# Patient Record
Sex: Female | Born: 1975 | Race: White | Hispanic: No | Marital: Married
Health system: Midwestern US, Academic
[De-identification: ages and names within clinical notes are randomized; demographics above are authoritative.]

---

## 2020-04-10 ENCOUNTER — Encounter: Admit: 2020-04-10 | Discharge: 2020-04-10 | Payer: BC Managed Care – PPO

## 2020-04-10 ENCOUNTER — Ambulatory Visit: Admit: 2020-04-10 | Discharge: 2020-04-10 | Payer: BC Managed Care – PPO

## 2020-04-10 DIAGNOSIS — R011 Cardiac murmur, unspecified: Secondary | ICD-10-CM

## 2020-05-19 ENCOUNTER — Encounter: Admit: 2020-05-19 | Discharge: 2020-05-19 | Payer: BC Managed Care – PPO

## 2020-05-27 ENCOUNTER — Encounter: Admit: 2020-05-27 | Discharge: 2020-05-27 | Payer: BC Managed Care – PPO

## 2020-05-27 DIAGNOSIS — I517 Cardiomegaly: Secondary | ICD-10-CM

## 2020-05-28 ENCOUNTER — Encounter: Admit: 2020-05-28 | Discharge: 2020-05-28 | Payer: BC Managed Care – PPO

## 2020-05-28 DIAGNOSIS — I517 Cardiomegaly: Secondary | ICD-10-CM

## 2020-05-28 DIAGNOSIS — R011 Cardiac murmur, unspecified: Secondary | ICD-10-CM

## 2020-05-28 DIAGNOSIS — R42 Dizziness and giddiness: Secondary | ICD-10-CM

## 2020-05-28 DIAGNOSIS — R931 Abnormal findings on diagnostic imaging of heart and coronary circulation: Secondary | ICD-10-CM

## 2020-05-28 DIAGNOSIS — R0600 Dyspnea, unspecified: Secondary | ICD-10-CM

## 2020-05-28 DIAGNOSIS — Z20822 Encounter for screening laboratory testing for COVID-19 virus in asymptomatic patient: Secondary | ICD-10-CM

## 2020-05-28 DIAGNOSIS — R002 Palpitations: Secondary | ICD-10-CM

## 2020-05-28 NOTE — Patient Instructions
Stress test at [facility]  If they don't call you to schedule.  Scheduling is [phone]    Cardiac MRI order is in you can call Linda to schedule [phone]    [facility] monitor will be mailed out       Follow up as directed.  Call sooner if issues.  Call the [place] nursing line at [phone].  Leave a detailed message for the nurse in [place] with how we can assist you and we will call you back.

## 2020-06-01 ENCOUNTER — Encounter: Admit: 2020-06-01 | Discharge: 2020-06-01 | Payer: BC Managed Care – PPO

## 2020-06-01 NOTE — Progress Notes
Ordering Physician:HANNEN   Type of Monitor: MCOT   Length of Study: 14  UJ:[id number]

## 2020-06-02 ENCOUNTER — Encounter: Admit: 2020-06-02 | Discharge: 2020-06-02 | Payer: BC Managed Care – PPO

## 2020-06-02 ENCOUNTER — Ambulatory Visit: Admit: 2020-06-02 | Discharge: 2020-06-02 | Payer: BC Managed Care – PPO

## 2020-06-02 DIAGNOSIS — R0600 Dyspnea, unspecified: Secondary | ICD-10-CM

## 2020-06-02 DIAGNOSIS — I10 Essential (primary) hypertension: Secondary | ICD-10-CM

## 2020-06-02 DIAGNOSIS — E785 Hyperlipidemia, unspecified: Secondary | ICD-10-CM

## 2020-06-10 ENCOUNTER — Encounter: Admit: 2020-06-10 | Discharge: 2020-06-10 | Payer: BC Managed Care – PPO

## 2020-06-10 NOTE — Telephone Encounter
-----   Message from Dorris Fetch, MD sent at 06/10/2020  8:40 AM CDT -----  This patient has mild abnormalities on the stress test.  I did order on her a cardiac MRI.Please schedule her to come back for a follow-up office visit in [place] but also to start perform, so we discussed the results.Thank you  ----- Message -----  From: Jana Half, MD  Sent: 06/04/2020   8:44 AM CDT  To: Dorris Fetch, MD

## 2020-06-10 NOTE — Telephone Encounter
-----   Message from Dorris Fetch, MD sent at 06/10/2020  8:40 AM CDT -----  This patient has mild abnormalities on the stress test.  I did order on her a cardiac MRI.Please schedule her to come back for a follow-up office visit in [place] but also to start perform, so we discussed the results.Thank you  ----- Message -----  From: Jana Half, MD  Sent: 06/04/2020   8:44 AM CDT  To: Dorris Fetch, MD

## 2020-06-10 NOTE — Telephone Encounter
Called pt with results and recommendations preferred to get all results and discuss with [facility] at an appt her husband was available to come too.

## 2020-06-23 ENCOUNTER — Encounter: Admit: 2020-06-23 | Discharge: 2020-06-23 | Payer: BC Managed Care – PPO

## 2020-06-23 ENCOUNTER — Ambulatory Visit: Admit: 2020-06-23 | Discharge: 2020-06-23 | Payer: BC Managed Care – PPO

## 2020-06-23 DIAGNOSIS — R0600 Dyspnea, unspecified: Secondary | ICD-10-CM

## 2020-06-23 DIAGNOSIS — I517 Cardiomegaly: Secondary | ICD-10-CM

## 2020-06-23 MED ORDER — GADOBENATE DIMEGLUMINE 529 MG/ML (0.1MMOL/0.2ML) IV SOLN
30 mL | Freq: Once | INTRAVENOUS | 0 refills | Status: CP
Start: 2020-06-23 — End: ?
  Administered 2020-06-23: 22:00:00 30 mL via INTRAVENOUS

## 2020-06-23 MED ORDER — SODIUM CHLORIDE 0.9 % IJ SOLN
20 mL | Freq: Once | INTRAVENOUS | 0 refills | Status: CP
Start: 2020-06-23 — End: ?

## 2020-06-25 ENCOUNTER — Encounter: Admit: 2020-06-25 | Discharge: 2020-06-25 | Payer: BC Managed Care – PPO

## 2020-06-25 NOTE — Telephone Encounter
-----   Message from Dorris Fetch, MD sent at 06/24/2020  4:56 PM CDT -----  Please call this patient and let her know that the cardiac MRI demonstrated that she probably does have hypertrophic cardiomyopathy.  I think we do have a hypertrophic cardiomyopathy clinic at [facility] and I seen this patients are seen either by Dr. Paris Lore in this clinic or in the valve clinic.  Please find out and let us further referred the patient.  At this point in time she probably does not need to come back and see me in [place], before being seen in the hypertrophic clinic.Thank you  ----- Message -----  From: Levander Campion Results  Sent: 06/24/2020  10:58 AM CDT  To: Dorris Fetch, MD

## 2020-07-11 ENCOUNTER — Encounter: Admit: 2020-07-11 | Discharge: 2020-07-11 | Payer: BC Managed Care – PPO

## 2020-07-11 NOTE — Progress Notes
Message sent to [employer] regardsing monitor orders.

## 2020-07-24 ENCOUNTER — Encounter: Admit: 2020-07-24 | Discharge: 2020-07-24 | Payer: BC Managed Care – PPO

## 2020-08-31 ENCOUNTER — Ambulatory Visit: Admit: 2020-08-31 | Discharge: 2020-08-31 | Payer: BC Managed Care – PPO

## 2020-08-31 ENCOUNTER — Encounter: Admit: 2020-08-31 | Discharge: 2020-08-31 | Payer: BC Managed Care – PPO

## 2020-08-31 DIAGNOSIS — I421 Obstructive hypertrophic cardiomyopathy: Secondary | ICD-10-CM

## 2020-08-31 DIAGNOSIS — I10 Essential (primary) hypertension: Secondary | ICD-10-CM

## 2020-08-31 DIAGNOSIS — R931 Abnormal findings on diagnostic imaging of heart and coronary circulation: Secondary | ICD-10-CM

## 2020-08-31 DIAGNOSIS — R011 Cardiac murmur, unspecified: Secondary | ICD-10-CM

## 2020-08-31 MED ORDER — METOPROLOL TARTRATE 25 MG PO TAB
25 mg | ORAL_TABLET | Freq: Two times a day (BID) | ORAL | 11 refills | 90.00000 days | Status: AC
Start: 2020-08-31 — End: ?

## 2020-08-31 NOTE — Progress Notes
Date of Service: 08/31/2020    Amanda Franco is a 44 y.o. female.       HPI     Amanda Franco is a 44 year old female who I am being asked to see for evaluation of hypertrophic cardiomyopathy.  She was noticed initially to have a flow murmur by her primary care physician Dr. Leda Min which prompted a referral to our cardiology practice.  Dr. Radford Pax is seen her in has completed her initial evaluation with an echocardiogram and a cardiac MRI.  I did have a chance to personally review her echocardiogram and she has normal left ventricular systolic function with an ejection fraction of 70%.  She did have at least moderate basal septal hypertrophy.  There was a resting outflow tract gradient of 23 mmHg with no Valsalva performed.  She did not have any significant associated mitral or tricuspid insufficiency.  There was systolic anterior motion of the anterior mitral leaflet with partial septal contact which I suspect is what is leading to her outflow tract gradient and murmur.  Additionally, she had a cardiac MRI performed which demonstrated basal anteroseptal hypertrophy of 2 cm with notable moderate systolic anterior motion of the mitral valve and turbulent outflow.  There was a small focus of nonischemic abnormal late gadolinium enhancement however this was much less than 10%.  Looking at her echocardiogram and cardiac MRI, I do believe that both of these are compatible with hypertrophic obstructive cardiomyopathy.  Her resting outflow tract gradient was less than 30 mmHg so I think we can work on optimizing pharmacotherapy.  She does have exertional shortness of breath when she is trying to climb the stairs she also noted some issues with shortness of breath and fluid retention with her last pregnancy 3 years ago.  She denies any family history of hypertrophic cardiomyopathy.  There is no family history of sudden cardiac death or any other high risk features that she possesses that would necessitate the need for an ICD.  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         Vitals:    08/31/20 1105   BP: 130/86   BP Source: Arm, Left Upper   Patient Position: Sitting   Pulse: 78   Weight: 72.8 kg (160 lb 9.6 oz)   Height: 1.6 m (5' 3)   PainSc: Zero     Body mass index is 28.45 kg/m?Marland Kitchen     Past Medical History  Patient Active Problem List    Diagnosis Date Noted   ? HOCM (hypertrophic obstructive cardiomyopathy) (HCC) 08/31/2020   ? Shortness of breath 05/28/2020   ? Lightheadedness 05/28/2020   ? Systolic murmur 05/28/2020   ? Abnormal echocardiogram 05/28/2020   ? Heart palpitations 05/28/2020   ? 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   HENT: Negative.    Eyes: Negative.    Cardiovascular: Positive for dyspnea on exertion.   Respiratory: Negative.    Endocrine: Negative.    Hematologic/Lymphatic: Negative.    Skin: Negative.    Musculoskeletal: Negative.    Gastrointestinal: Negative.    Genitourinary: Negative.    Neurological: Negative.    Psychiatric/Behavioral: Negative.    Allergic/Immunologic: Negative.        Physical Exam  Physical Exam   General Appearance: alert  and oriented, no acute distress  Skin: warm, moist, no ulcers  Head: normocephalic, symmetric  Eyes: EOMI, PERRL, sclera are clear and without icterus  ENT: unremarkable, nares patent  Neck Veins: neck veins are flat, neck veins are not distended  Carotid Arteries: normal carotid upstroke bilaterally, no bruits  Chest Inspection: chest is normal in appearance  Auscultation/Percussion: lungs clear to auscultation, no rales, rhonchi, wheezes or friction rub appreciated  Cardiac Rhythm: regular rhythm and normal rate  Cardiac Auscultation: Normal S1 & S2, no S3 or S4, no rub - normal pmi  Murmurs: 2/6 SEM LUSB  Extremities: no lower extremity edema bilaterally; 2+ symmetric distal pulses  Muskuloskeletal: no obvious deformity  Neurologic Exam: neurological assessment grossly intact  Mood and Affect: Appropriate      Cardiovascular Studies  ECG - NSR, inferior Q    Cardiovascular Health Factors  Vitals BP Readings from Last 3 Encounters:   08/31/20 130/86   05/28/20 120/80   04/10/20 137/78     Wt Readings from Last 3 Encounters:   08/31/20 72.8 kg (160 lb 9.6 oz)   06/23/20 76.7 kg (169 lb)   05/28/20 76.7 kg (169 lb)     BMI Readings from Last 3 Encounters:   08/31/20 28.45 kg/m?   06/23/20 29.94 kg/m?   05/28/20 29.94 kg/m?      Smoking Social History     Tobacco Use   Smoking Status Never Smoker   Smokeless Tobacco Never Used      Lipid Profile No results found for: CHOL  No results found for: HDL  No results found for: LDL  No results found for: TRIG   Blood Sugar No results found for: HGBA1C  No results found for: GLU, GLUF, GLUPOC       Problems Addressed Today  Encounter Diagnoses   Name Primary?   ? Hypertension, unspecified type Yes   ? Abnormal echocardiogram    ? HOCM (hypertrophic obstructive cardiomyopathy) (HCC)    ? Systolic murmur        Assessment and Plan     1. Presumed hypertrophic obstructive cardiomyopathy-I would like to go ahead and initiate her on metoprolol 25 mg twice daily given her resting outflow tract gradient.  On physical exam she did have a systolic ejection murmur which was most prominent the left upper sternal border and slightly augmented with Valsalva.  Our goal is to maintain a resting gradient less than 30 mmHg or a provokable gradient less than 50 mmHg.  If her outflow tract gradients exceed this, we will need to discuss further optimization of her pharmacotherapy or a septal reduction strategy to optimize her hemodynamics.  She is currently completing a Holter monitor and denies any history of palpitations.  She does not possess any high risk features that would necessitate the need for an ICD.  Interestingly, her ECG demonstrates Q waves in the inferior leads and there is no obvious left ventricular hypertrophy.    With regards to the follow-up plan, I would advocate an annual surveillance echocardiogram and 48-hour Holter monitor to ensure that her resting gradient is less than 30 mmHg and provokable gradient is less than 50 mmHg.  We discussed the most common rhythm issue being atrial fibrillation however she does not have any symptoms to suggest this is the case.  If she does develop progressive symptoms a more direct evaluation may be warranted as well.  She is going to be following up with Dr. Radford Pax, her primary cardiologist in January  and I would like to see her back in August with a repeat echocardiogram.  We have given her information on hypertrophic cardiomyopathy and we will plan to follow-up with any further questions.  Thank you for allowing me to participate in her care.         Current Medications (including today's revisions)  ? metoprolol tartrate 25 mg tablet Take one tablet by mouth twice daily.

## 2020-10-05 ENCOUNTER — Encounter: Admit: 2020-10-05 | Discharge: 2020-10-05 | Payer: BC Managed Care – PPO

## 2020-10-05 NOTE — Progress Notes
Records Request-STAT    Medical records request for continuation of care:    Patient has appointment TOMORROW   with  Dr. Avie Arenas .    Please fax records to Cardiovascular Medicine [place] [facility] [phone]    Request records:    Recent Labs        Thank you,      Cardiovascular Medicine  [facility]  [address]  [place], [place] [address]  Phone:  [phone]  Fax:  [phone]

## 2020-10-06 ENCOUNTER — Encounter: Admit: 2020-10-06 | Discharge: 2020-10-06 | Payer: BC Managed Care – PPO

## 2020-10-07 ENCOUNTER — Encounter: Admit: 2020-10-07 | Discharge: 2020-10-07 | Payer: BC Managed Care – PPO

## 2021-02-09 ENCOUNTER — Encounter: Admit: 2021-02-09 | Discharge: 2021-02-09 | Payer: BC Managed Care – PPO

## 2021-09-01 ENCOUNTER — Encounter: Admit: 2021-09-01 | Discharge: 2021-09-01 | Payer: Private Health Insurance - Indemnity

## 2021-09-01 MED ORDER — METOPROLOL TARTRATE 25 MG PO TAB
ORAL_TABLET | Freq: Two times a day (BID) | ORAL | 0 refills | 90.00000 days | Status: AC
Start: 2021-09-01 — End: ?

## 2021-09-09 ENCOUNTER — Encounter: Admit: 2021-09-09 | Discharge: 2021-09-09 | Payer: Private Health Insurance - Indemnity

## 2021-09-09 NOTE — Progress Notes
Pts primary insurance, [employer] does not require PA for echos.    Faxed OV note from Nov 2021 to pts secondary insurance- [facility] [employer]. Fax: [phone] for clinical review for echo approval.    Service reference number: [id number]

## 2021-10-05 ENCOUNTER — Encounter: Admit: 2021-10-05 | Discharge: 2021-10-05 | Payer: Private Health Insurance - Indemnity

## 2021-10-05 MED ORDER — METOPROLOL TARTRATE 25 MG PO TAB
25 mg | ORAL_TABLET | Freq: Two times a day (BID) | ORAL | 0 refills | 90.00000 days | Status: AC
Start: 2021-10-05 — End: ?

## 2021-10-15 ENCOUNTER — Encounter: Admit: 2021-10-15 | Discharge: 2021-10-15 | Payer: Private Health Insurance - Indemnity

## 2021-10-15 NOTE — Progress Notes
Records Request    Medical records request for continuation of care:    Patient has appointment on 10/21/2021   with  Dr. Nickolas Madrid* .    Please fax records to Cardiovascular Medicine [place] [facility] [phone]    Request records: STAT          EKG's  [phone])       Holter Monitor [phone])      Recent Cardiac Testing (2022--2023)    Any cardiac-related records (2022--2023)    Recent Labs (CMP, Lipid Panel)              Thank you,      Cardiovascular Medicine  [facility]  [address]  [place], [place] [address]  Phone:  [phone]  Fax:  [phone]

## 2021-10-20 ENCOUNTER — Encounter: Admit: 2021-10-20 | Discharge: 2021-10-20 | Payer: Private Health Insurance - Indemnity

## 2021-10-20 ENCOUNTER — Ambulatory Visit: Admit: 2021-10-20 | Discharge: 2021-10-20 | Payer: Private Health Insurance - Indemnity

## 2021-10-20 DIAGNOSIS — I10 Essential (primary) hypertension: Secondary | ICD-10-CM

## 2021-10-20 DIAGNOSIS — R931 Abnormal findings on diagnostic imaging of heart and coronary circulation: Secondary | ICD-10-CM

## 2021-10-21 ENCOUNTER — Encounter: Admit: 2021-10-21 | Discharge: 2021-10-21 | Payer: Private Health Insurance - Indemnity

## 2021-10-21 ENCOUNTER — Ambulatory Visit: Admit: 2021-10-21 | Discharge: 2021-10-22 | Payer: Private Health Insurance - Indemnity

## 2021-10-21 DIAGNOSIS — I517 Cardiomegaly: Secondary | ICD-10-CM

## 2021-10-21 DIAGNOSIS — R002 Palpitations: Secondary | ICD-10-CM

## 2021-10-21 DIAGNOSIS — R460 Very low level of personal hygiene: Secondary | ICD-10-CM

## 2021-10-21 DIAGNOSIS — R42 Dizziness and giddiness: Secondary | ICD-10-CM

## 2021-10-21 DIAGNOSIS — R931 Abnormal findings on diagnostic imaging of heart and coronary circulation: Secondary | ICD-10-CM

## 2021-10-21 DIAGNOSIS — I421 Obstructive hypertrophic cardiomyopathy: Secondary | ICD-10-CM

## 2021-10-21 DIAGNOSIS — Z136 Encounter for screening for cardiovascular disorders: Secondary | ICD-10-CM

## 2021-10-21 DIAGNOSIS — R0602 Shortness of breath: Secondary | ICD-10-CM

## 2021-10-21 MED ORDER — METOPROLOL TARTRATE 25 MG PO TAB
ORAL_TABLET | ORAL | 0 refills | 90.00000 days | Status: AC
Start: 2021-10-21 — End: ?

## 2021-10-21 NOTE — Patient Instructions
Thank you for visiting our office today.    We would like to make the following medication adjustments:      Metoprolol 25mg  in morning, 12.5mg  at noon, 25mg  in the evening      Otherwise continue the same medications as you have been doing.          We will be pursuing the following tests after your appointment today:       Orders Placed This Encounter    ECG 12-LEAD    metoprolol tartrate 25 mg tablet         We will plan to see you back in 12 months.  Please call in the meantime with any questions or concerns.        Please allow 5-7 business days for our providers to review your results. All normal results will go to MyChart. If you do not have Mychart, it is strongly recommended to get this so you can easily view all your results. If you do not have mychart, we will attempt to call you once with normal lab and testing results. If we cannot reach you by phone with normal results, we will send you a letter.  If you have not heard the results of your testing after one week please give a call.       Your Cardiovascular Medicine [place] [facility] [place], Gabriel Rung, Brett Canales, and [place])  phone number is [phone].

## 2021-10-28 ENCOUNTER — Encounter: Admit: 2021-10-28 | Discharge: 2021-10-28 | Payer: Private Health Insurance - Indemnity

## 2021-10-28 DIAGNOSIS — I421 Obstructive hypertrophic cardiomyopathy: Secondary | ICD-10-CM

## 2021-10-28 NOTE — Telephone Encounter
Reviewed echo with Dr. Paris Lore. Per Dr. Paris Lore will order TEE for futher eval.    LVM with pt requesting return call.

## 2021-10-29 ENCOUNTER — Encounter: Admit: 2021-10-29 | Discharge: 2021-10-29 | Payer: Private Health Insurance - Indemnity

## 2021-10-29 NOTE — Telephone Encounter
Received VM from pt regarding why the TEE was ordered. Returned call. Spoke to pt. Pt advised that per her last echo report the TEE was ordered to look closer at her aortic valve and rule out subaortic membrane. Pt verbalized understanding.

## 2021-11-03 ENCOUNTER — Encounter: Admit: 2021-11-03 | Discharge: 2021-11-03 | Payer: Private Health Insurance - Indemnity

## 2021-11-03 NOTE — Telephone Encounter
Unable to reach pt at this time. Voice message was left asking pt to call TEE office to receive instructions for TEE procedure on 11/04/21. This RN will attempt to call again.

## 2021-11-03 NOTE — Telephone Encounter
Called pt regarding TEE tomorrow at 1030.  Arrive at 0930.  [facility]. [address].  Need driver which will be husband.  NPO after midnight tonight. Hold vitamins, water pills and diabetes if applicable.  Take heart med with sips of water in am.  Explained TEE procedure. Questions answered.  Reviewed discharge instructions.  Pt had many questions.  Our phone number is [phone].  RK

## 2021-11-04 ENCOUNTER — Ambulatory Visit: Admit: 2021-11-04 | Discharge: 2021-11-04 | Payer: Private Health Insurance - Indemnity

## 2021-11-04 ENCOUNTER — Encounter: Admit: 2021-11-04 | Discharge: 2021-11-04 | Payer: Private Health Insurance - Indemnity

## 2021-11-04 DIAGNOSIS — I421 Obstructive hypertrophic cardiomyopathy: Secondary | ICD-10-CM

## 2021-11-04 MED ORDER — PROPOFOL 10 MG/ML IV EMUL 20 ML (INFUSION)(AM)(OR)
INTRAVENOUS | 0 refills | Status: DC
Start: 2021-11-04 — End: 2021-11-04
  Administered 2021-11-04: 17:00:00 120 ug/kg/min via INTRAVENOUS

## 2021-11-04 MED ORDER — SODIUM CHLORIDE 0.9 % IV SOLP (OR) 500ML
INTRAVENOUS | 0 refills | Status: DC
Start: 2021-11-04 — End: 2021-11-04
  Administered 2021-11-04: 17:00:00 via INTRAVENOUS

## 2021-11-04 NOTE — Anesthesia Post-Procedure Evaluation
Post-Anesthesia Evaluation    Name: Amanda Franco      MRN: [id number]     DOB: 06-Oct-1975     Age: 46 y.o.     Sex: female   __________________________________________________________________________     Procedure Information     Anesthesia Start Date/Time: 11/04/21 1122    Scheduled providers: Vena Austria, MD; Jacquiline Doe, RN    Procedure: TRANSESOPHAGEAL ECHO    Location: Cardiology:[facility]          Post-Anesthesia Vitals  BP: 120/80 (02/02 1220)  Pulse: 71 (02/02 1220)  Respirations: 12 PER MINUTE (02/02 1220)  SpO2: 100 % (02/02 1220)  O2 Device: None (Room air) (02/02 1220) No vitals data found for the desired time range.        Post Anesthesia Evaluation Note    Evaluation location: Pre/Post  Patient participation: recovered; patient participated in evaluation  Level of consciousness: alert  Pain management: adequate    Hydration: normovolemia  Temperature: 36.0C - 38.4C  Airway patency: adequate    Perioperative Events       Post-op nausea and vomiting: no PONV    Postoperative Status  Cardiovascular status: hemodynamically stable  Respiratory status: spontaneous ventilation        Perioperative Events  There were no known notable events for this encounter.

## 2021-11-04 NOTE — Progress Notes
Pre-Operative Assessment for TEE or Cardioversion    Date of Service:  11/04/2021    Amanda Franco is a 46 y.o. y.o. female. With significant HOCM, MR, who is referred for TEE Indication: subaortic membrane evaluation .      she has been compliant with her  none Missed dose: N/Aand ... bleeding. she is Positive for: none and Positive for: none.      GI procedures: none           When: No    Chest pain:  No   SOB: Yes          Medical History:  Medical History:   Diagnosis Date   ? Ventricular hypertrophy 05/27/2020        Surgical History:   No past surgical history on file.    Social History     Social History     Tobacco Use   ? Smoking status: Never   ? Smokeless tobacco: Never   Substance Use Topics   ? Alcohol use: Not Currently   ? Drug use: Never         Allergies                                        No Known Allergies       Current Medications  Current Outpatient Medications on File Prior to Encounter   Medication Sig Dispense Refill   ? metoprolol tartrate 25 mg tablet Take 25mg  by mouth in the morning, take 12.5mg  by mouth at noon, and take 25mg  by mouth in the evening. 180 tablet 0   ? QELBREE 200 mg cp24 Take 3 capsules by mouth daily.       No current facility-administered medications on file prior to encounter.       Vitals  Estimated body mass index is 27.46 kg/m? as calculated from the following:    Height as of this encounter: 160 cm (5' 3).    Weight as of this encounter: 70.3 kg (155 lb).       Patient appears alert and oriented: Yes  NPO: for greater than 8 hours except for clear liquids  Inpatient IV status: 20g R AC     Diagnostic Tests  No results found for: WBC  No results found for: HGB  No results found for: HCT  No results found for: PLTCT  No results found for: NA  No results found for: K  No results found for: MG  No results found for: BUN  No results found for: CR  No results found for: GLU    Last MAC INR Flow Sheet Entry:    Last recorded Lab results:   No results found for: INR  No results found for: PTT        Blood Cultures  Resulted Micro Last 72 Hrs    No results found         Last TEE date: None  Last Cardioversion date: None  Echo procedures within the past 30 days:  No results found.      Device Information on File  No results found for: GENERATOR, EPDEVTYP      Additional Comments:  None    Plan:  Dr. Berdine Addison will plan to proceed with the  TEE.

## 2021-11-04 NOTE — Progress Notes
Care Plan   Care Category & Patient Outcome Goal Met Treatment/  Interventions Plan of the Day RN Name   Cardiovascular  Hemodynamic stability and adequate peripheral perfusion.   Yes   Refer to  patient's chart. Monitor VS per sedation standard. Monitor ECG continuously.  Assess/maintain IV patency.   Kiley Pietig, RN   Respiratory  Patent airway, ease of respiration, and adequate oxygenation.   Yes   Refer to  patient's chart. Maintain open airway. Assess respirations. Monitor O2 saturations. Titrate O2 to keep sat>= 95% or baseline.   Kiley Pietig, RN   Psychological/Emotional/Spiritual  Cope with procedure with support in place.  Spiritual needs are addressed.     Yes   Refer to  patient's chart. Provide adequate and thorough instructions.  Provide a caring and supportive environment.  Communicate patients concerns with other members of the health team.   Kiley Pietig, RN   Pain  Patients pain goal met.   Yes   Refer to  patient's chart. Prepare patient for potentially uncomfortable procedure.  Observe for verbal/nonverbal complaints of pain.  Assess pain.  Provide comfort measures.   Kiley Pietig, RN   Safety/Fall Risk  Free from injury, security maintained.   Yes High Risk    Refer to  patient's chart.   Follow nursing standard of practice for high risks fall patients.   Kiley Pietig, RN   Knowledge Base  Verbalize understanding of procedure/information provided.   Yes   Refer to  patient's chart. Describe the procedure along with what symptoms to expect.  Evaluate patients understanding of procedure.  Encourage patient to ask questions.  Provide additional information as needed.   Kiley Pietig, RN

## 2021-11-04 NOTE — Anesthesia Pre-Procedure Evaluation
Anesthesia Pre-Procedure Evaluation    Name: Amanda Franco      MRN: [id number]     DOB: [birth year]     Age: 46 y.o.     Sex: female   _________________________________________________________________________     Procedure Info:   Procedure Information     Date/Time: 11/04/21 1030    Scheduled providers: Vena Austria, MD; Jacquiline Doe, RN    Procedure: TRANSESOPHAGEAL ECHO    Location: Cardiology:[facility]          Physical Assessment  Vital Signs (last filed in past 24 hours):  BP: 120/74 (02/02 1015)  Pulse: 61 (02/02 1015)  SpO2: 100 % (02/02 1015)  O2 Device: None (Room air) (02/02 1015)  Height: 160 cm (5' 3) (02/02 0956)  Weight: 70.3 kg (155 lb) (02/02 1008)      Patient History   No Known Allergies     Current Medications    Medication Directions   metoprolol tartrate 25 mg tablet Take 25mg  by mouth in the morning, take 12.5mg  by mouth at noon, and take 25mg  by mouth in the evening.   QELBREE 200 mg cp24 Take 3 capsules by mouth daily.         Review of Systems/Medical History        PONV Screening: Non-smoker        Pulmonary          Shortness of breath      Cardiovascular         Valvular problems/murmurs        Orthopnea      Dyspnea on exertion           Physical Exam    Airway Findings      Mallampati: I      TM distance: >3 FB      Neck ROM: full      Mouth opening: good    Dental Findings: Negative      Cardiovascular Findings:         Other findings: murmur    Pulmonary Findings:       Breath sounds clear to auscultation.    Neurological Findings:       Alert and oriented x 3       Diagnostic Tests  Hematology: No results found for: HGB, HCT, PLTCT, WBC, NEUT, ANC, LYMPH, ALC, ABSLYMPHCT, MONA, AMC, EOSA, ABC, BASOPHILS, MCV, MCH, MCHC, MPV, RDW      General Chemistry: No results found for: NA, K, CL, CO2, GAP, BUN, CR, GLU, CA, KETONES, ALBUMIN, LACTIC, OBSCA, MG, TOTBILI, TOTBILCB, PO4   Coagulation: No results found for: PT, PTT, INR      Anesthesia Plan    ASA score: 3 Plan: MAC  Induction method: intravenous      Informed Consent  Anesthetic plan and risks discussed with patient.        Plan discussed with: anesthesiologist and CRNA.

## 2021-11-04 NOTE — Patient Instructions
CARDIOLOGY PROCEDURES           POST SEDATION INSTRUCTIONS      Patient Name: Amanda Franco  MRN#: [id number]  Date: 11/04/2021      Please follow the instructions listed below:    Avoid food and beverages (especially hot liquids) until sensation has returned to your throat.    Resume diet    The following day you may experience a minor sore throat.    Please have someone accompany you, as YOU SHOULD NOT drive or operate machinery for at least 12-24 hours following the procedure.    There may be some residual effects from the sedatives during the procedure.  Do not drive a vehicle for up to 24 hours after receiving sedation.  Do not operate heavy or potentially harmful equipment  Do not make legally binding decisions  Do not drink alcohol for up to 24 hours  Do not communicate through social media for 24 hours.    Other instructions: continue all medications  If you have question or concerns about this procedure, please contact the Cardiology office at [phone], and ask to speak to one of the nurses.      Current Medications List:   metoprolol tartrate 25 mg tablet Take 25mg  by mouth in the morning, take 12.5mg  by mouth at noon, and take 25mg  by mouth in the evening.    QELBREE 200 mg cp24 Take 3 capsules by mouth daily.         Instructions Given To: pt and husband,    Instructions Given By: , RN

## 2022-01-03 ENCOUNTER — Encounter: Admit: 2022-01-03 | Discharge: 2022-01-03 | Payer: Private Health Insurance - Indemnity

## 2022-01-03 ENCOUNTER — Ambulatory Visit: Admit: 2022-01-03 | Discharge: 2022-01-03 | Payer: Private Health Insurance - Indemnity

## 2022-01-03 DIAGNOSIS — I421 Obstructive hypertrophic cardiomyopathy: Secondary | ICD-10-CM

## 2022-01-03 DIAGNOSIS — I10 Essential (primary) hypertension: Secondary | ICD-10-CM

## 2022-01-03 DIAGNOSIS — R0989 Other specified symptoms and signs involving the circulatory and respiratory systems: Secondary | ICD-10-CM

## 2022-01-03 DIAGNOSIS — I517 Cardiomegaly: Secondary | ICD-10-CM

## 2022-01-03 MED ORDER — METOPROLOL SUCCINATE 50 MG PO TB24
50 mg | ORAL_TABLET | Freq: Every day | ORAL | 11 refills | 90.00000 days | Status: AC
Start: 2022-01-03 — End: ?

## 2022-01-03 NOTE — Progress Notes
Date of Service: 01/03/2022    Amanda Franco is a 46 y.o. female.       HPI     Amanda Franco is a 46 year old female with a history of hypertrophic obstructive cardiomyopathy who is currently on medical therapy.  We did have some early concerns about the possibility of a subaortic membrane.  She had a recent transesophageal echocardiogram that did not demonstrate subaortic membrane.  She does have a basal septum which measures around 1.4 to 1.5 cm.  She does have a resting and provokable gradient across her outflow tract.  I had a chance to review her recent transesophageal echocardiogram she does have some systolic anterior motion of the anterior mitral leaflet with concurrent mild mitral insufficiency as well.  From a symptom standpoint, she notes class II-III symptoms and gets fairly short of breath when climbing a flight of stairs.  She states that she will have to wait at the top of the stairs to catch her breath.  About a half a minute.  She does note some changes in her symptoms based on the timing of her metoprolol so we talked about switching her metoprolol over to metoprolol succinate as a once a day long-acting medication which will help improve compliance.  She has not had any significant exertional chest discomfort denies any lightheadedness or dizziness in the last year or 2.  Her primary focus is really optimizing her symptoms in the setting of obstructive cardiomyopathy.         Vitals:    01/03/22 1043   BP: 110/64   BP Source: Arm, Right Upper   Pulse: 67   PainSc: Zero   Weight: 73.5 kg (162 lb)   Height: 160 cm (5' 3)     Body mass index is 28.7 kg/m?Marland Kitchen     Past Medical History  Patient Active Problem List    Diagnosis Date Noted   ? Hypertension 01/03/2022   ? Poor personal hygiene 10/21/2021   ? HOCM (hypertrophic obstructive cardiomyopathy) (HCC) 08/31/2020   ? Shortness of breath 05/28/2020   ? Lightheadedness 05/28/2020   ? Systolic murmur 05/28/2020   ? Abnormal echocardiogram 05/28/2020   ? Heart palpitations 05/28/2020   ? 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   HENT: Negative.    Eyes: Negative.    Cardiovascular: Negative.    Respiratory: Negative.    Endocrine: Negative.    Hematologic/Lymphatic: Negative.    Skin: Negative.    Musculoskeletal: Negative.    Gastrointestinal: Negative.    Genitourinary: Negative.    Neurological: Negative.    Psychiatric/Behavioral: Negative.    All other systems reviewed and are negative.      Physical Exam  Physical Exam   General Appearance: alert and oriented, no acute distress  Skin: warm, moist, no ulcers  Head: normocephalic, symmetric  Eyes: EOMI, PERRL, sclera are clear and without icterus  ENT: unremarkable, nares patent  Neck Veins: neck veins are flat, neck veins are not distended  Carotid Arteries: normal carotid upstroke bilaterally, no bruits  Chest Inspection: chest is normal in appearance  Auscultation/Percussion: lungs clear to auscultation, no rales, rhonchi, wheezes or friction rub appreciated  Cardiac Rhythm: regular rhythm and normal rate  Cardiac Auscultation: Normal S1 & S2, no S3 or S4, no rub - normal pmi  Murmurs: 2/6 SEM  Neurologic Exam: neurological assessment grossly intact  Mood and Affect: Appropriate      Cardiovascular Health  Factors  Vitals BP Readings from Last 3 Encounters:   01/03/22 110/64   11/04/21 120/80   10/21/21 112/76     Wt Readings from Last 3 Encounters:   01/03/22 73.5 kg (162 lb)   11/04/21 70.3 kg (155 lb)   10/21/21 71.1 kg (156 lb 12.8 oz)     BMI Readings from Last 3 Encounters:   01/03/22 28.70 kg/m?   11/04/21 27.46 kg/m?   10/21/21 27.78 kg/m?      Smoking Social History     Tobacco Use   Smoking Status Never   Smokeless Tobacco Never      Lipid Profile No results found for: CHOL  No results found for: HDL  No results found for: LDL  No results found for: TRIG   Blood Sugar No results found for: HGBA1C  No results found for: GLU, GLUF, GLUPOC       Problems Addressed Today  Encounter Diagnoses   Name Primary?   ? Cardiovascular symptoms Yes   ? Hypertension, unspecified type    ? HOCM (hypertrophic obstructive cardiomyopathy) (HCC)        Assessment and Plan     1. Hypertrophic obstructive cardiomyopathy-I would like to discontinue her metoprolol tartrate and start her metoprolol succinate 50 mg daily.  Her goal is to maintain a heart rate of around 60 bpm.  We will plan to repeat her echocardiogram and see her back in 3 months after repeat echocardiogram.  If she continues to have a resting gradient across the outflow tract exceeding 30 mmHg or a provokable gradient exceeding 50 mmHg, we may want to discuss options for septal reduction to help improve her symptoms.    She is continuing to follow with Dr. Radford Pax for cardiology and I will plan to see her back to reevaluate her response to the change in Toprol-XL with regards to her outflow tract obstruction.  Thank you for allowing me to participate in her care.         Current Medications (including today's revisions)  ? FLUoxetine (PROZAC) 20 mg capsule Take one capsule by mouth daily.   ? metoprolol succinate XL (TOPROL XL) 50 mg extended release tablet Take one tablet by mouth daily.

## 2022-01-03 NOTE — Patient Instructions
Change the Toprol 50mg  daily    We will plan to see you back in 3 months with an Echocardiogram

## 2022-03-30 ENCOUNTER — Ambulatory Visit: Admit: 2022-03-30 | Discharge: 2022-03-30 | Payer: Private Health Insurance - Indemnity

## 2022-03-30 ENCOUNTER — Encounter: Admit: 2022-03-30 | Discharge: 2022-03-30 | Payer: Private Health Insurance - Indemnity

## 2022-03-30 DIAGNOSIS — I10 Essential (primary) hypertension: Secondary | ICD-10-CM

## 2022-03-30 DIAGNOSIS — I421 Obstructive hypertrophic cardiomyopathy: Secondary | ICD-10-CM

## 2022-04-07 ENCOUNTER — Ambulatory Visit: Admit: 2022-04-07 | Discharge: 2022-04-07 | Payer: Private Health Insurance - Indemnity

## 2022-04-07 ENCOUNTER — Encounter: Admit: 2022-04-07 | Discharge: 2022-04-07 | Payer: Private Health Insurance - Indemnity

## 2022-04-07 DIAGNOSIS — I421 Obstructive hypertrophic cardiomyopathy: Secondary | ICD-10-CM

## 2022-04-07 DIAGNOSIS — I517 Cardiomegaly: Secondary | ICD-10-CM

## 2022-04-07 DIAGNOSIS — R42 Dizziness and giddiness: Secondary | ICD-10-CM

## 2022-04-07 DIAGNOSIS — I10 Essential (primary) hypertension: Secondary | ICD-10-CM

## 2022-04-07 DIAGNOSIS — R0989 Other specified symptoms and signs involving the circulatory and respiratory systems: Secondary | ICD-10-CM

## 2022-04-07 DIAGNOSIS — R0602 Shortness of breath: Secondary | ICD-10-CM

## 2022-04-07 MED ORDER — METOPROLOL SUCCINATE 100 MG PO TB24
100 mg | ORAL_TABLET | Freq: Every day | ORAL | 3 refills | 90.00000 days | Status: AC
Start: 2022-04-07 — End: ?

## 2022-04-07 NOTE — Progress Notes
Date of Service: 04/07/2022    Amanda Franco is a 46 y.o. female.       HPI     Amanda Franco is a 46 year old female with a diagnosis of hypertrophic obstructive cardiomyopathy.  She has previously undergone a cardiac MRI back in September 2021.  At that time she had a basal anteroseptum that measured 2 cm.  She has had some very minimal late gadolinium enhancement however this was not in excess of 10%.  She has been having difficulties with left ventricular outflow tract obstruction and had a recent echocardiogram in June which demonstrated a provokable gradient of around 60 mmHg at rest.  We had changed her beta-blocker over to Toprol-XL to help improve compliance and tolerability.  She is currently on 50 mg and we will plan to increase her Toprol-XL to 100 mg.  Her ECG in the office today demonstrates a normal sinus rhythm with a rate of 75.  She does not have voltage criteria for left ventricular hypertrophy and does have what appears to be a pseudo infarct pattern involving the inferior wall leads II, III and aVF.  She denies angina but does have exertional dyspnea.  She continues to get short of breath when she exerts herself or climb stairs.  We have not performed coronary angiograms previously.         Vitals:    04/07/22 1245   BP: 110/70   BP Source: Arm, Left Upper   Pulse: 75   PainSc: Zero   Weight: 81.1 kg (178 lb 12.8 oz)   Height: 160 cm (5' 3)     Body mass index is 31.67 kg/m?Marland Kitchen     Past Medical History  Patient Active Problem List    Diagnosis Date Noted   ? Hypertension 01/03/2022   ? Poor personal hygiene 10/21/2021   ? HOCM (hypertrophic obstructive cardiomyopathy) (HCC) 08/31/2020   ? Shortness of breath 05/28/2020   ? Lightheadedness 05/28/2020   ? Systolic murmur 05/28/2020   ? Abnormal echocardiogram 05/28/2020   ? Heart palpitations 05/28/2020   ? 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   HENT: Negative.    Eyes: Negative.    Cardiovascular: Negative. Respiratory: Negative.    Endocrine: Negative.    Hematologic/Lymphatic: Negative.    Skin: Negative.    Musculoskeletal: Negative.    Gastrointestinal: Negative.    Genitourinary: Negative.    Neurological: Negative.    Psychiatric/Behavioral: Negative.    All other systems reviewed and are negative.      Physical Exam  Physical Exam   General Appearance: alert and oriented, no acute distress  Skin: warm, moist, no ulcers  Head: normocephalic, symmetric  Eyes: EOMI, PERRL, sclera are clear and without icterus  ENT: unremarkable, nares patent  Neck Veins: neck veins are flat, neck veins are not distended  Carotid Arteries: normal carotid upstroke bilaterally, no bruits  Chest Inspection: chest is normal in appearance  Auscultation/Percussion: lungs clear to auscultation, no rales, rhonchi, wheezes or friction rub appreciated  Cardiac Rhythm: regular rhythm and normal rate  Cardiac Auscultation: Normal S1 & S2, no S3 or S4, no rub - normal pmi  Murmurs: 2/6 SEM  Extremities: no lower extremity edema bilaterally; 2+ symmetric distal pulses  Muskuloskeletal: no obvious deformity  Neurologic Exam: neurological assessment grossly intact  Mood and Affect: Appropriate      Cardiovascular Studies  ECG - NSR, inf Q    Cardiovascular Health  Factors  Vitals BP Readings from Last 3 Encounters:   04/07/22 110/70   03/30/22 119/68   01/03/22 110/64     Wt Readings from Last 3 Encounters:   04/07/22 81.1 kg (178 lb 12.8 oz)   03/30/22 69.9 kg (154 lb)   01/03/22 73.5 kg (162 lb)     BMI Readings from Last 3 Encounters:   04/07/22 31.67 kg/m?   03/30/22 27.28 kg/m?   01/03/22 28.70 kg/m?      Smoking Social History     Tobacco Use   Smoking Status Never   Smokeless Tobacco Never      Lipid Profile No results found for: CHOL  No results found for: HDL  No results found for: LDL  No results found for: TRIG   Blood Sugar No results found for: HGBA1C  No results found for: GLU, GLUF, GLUPOC       Problems Addressed Today  Encounter Diagnoses   Name Primary?   ? HOCM (hypertrophic obstructive cardiomyopathy) (HCC) Yes   ? Cardiovascular symptoms    ? Ventricular hypertrophy    ? Primary hypertension    ? Lightheadedness    ? Shortness of breath        Assessment and Plan     1. Hypertrophic obstructive cardiomyopathy-we will plan to increase her Toprol-XL to 100 mg daily and reevaluate her symptoms in 2 weeks.  Her cardiac MRI did demonstrate basal septal hypertrophy with a basal septum measuring 2 cm however her ECG has a pseudo infarct pattern.  We have not performed coronary angiograms in the past.  There may be a role for secondary work-up for other causes of left ventricular hypertrophy in light of her ECG in addition to performing coronary angiograms to evaluate her coronary anatomy.  I would like to have her seen by Dr. Sherryll Burger in our heart failure program who also has a specialty in hypertrophic cardiomyopathy and secondary cardiomyopathies.  We will be in contact with her in 2 weeks and we will tentatively plan to follow-up in 3 months.  We did discuss considering initiating Camzyos depending on her response to Toprol-XL.         Current Medications (including today's revisions)  ? FLUoxetine (PROZAC) 20 mg capsule Take one capsule by mouth daily.   ? metoprolol succinate XL (TOPROL XL) 100 mg extended release tablet Take one tablet by mouth daily.

## 2022-04-12 ENCOUNTER — Encounter: Admit: 2022-04-12 | Discharge: 2022-04-12 | Payer: Private Health Insurance - Indemnity

## 2022-04-21 ENCOUNTER — Encounter: Admit: 2022-04-21 | Discharge: 2022-04-21 | Payer: Private Health Insurance - Indemnity

## 2022-04-21 NOTE — Telephone Encounter
I called patient to see how her symptoms were since her OV with [facility] and the increase of Toprol to 100mg  daily.  Per OV:    "Hypertrophic obstructive cardiomyopathy-we will plan to increase her Toprol-XL to 100 mg daily and reevaluate her symptoms in 2 weeks. Marland KitchenWe did discuss considering initiating Camzyos depending on her response to Toprol-XL."    I had to LVM for call back.  Patient called back and LVM reporting that initially her symptoms improved, but after a few days she could not tell any difference.  Patient reports she now feels about the same as she had previously.  Will route to provider for review.

## 2022-04-22 ENCOUNTER — Encounter: Admit: 2022-04-22 | Discharge: 2022-04-22 | Payer: Private Health Insurance - Indemnity

## 2022-05-09 ENCOUNTER — Encounter: Admit: 2022-05-09 | Discharge: 2022-05-09 | Payer: Private Health Insurance - Indemnity

## 2022-06-01 ENCOUNTER — Encounter: Admit: 2022-06-01 | Discharge: 2022-06-01 | Payer: Private Health Insurance - Indemnity

## 2022-06-22 ENCOUNTER — Encounter: Admit: 2022-06-22 | Discharge: 2022-06-22 | Payer: Private Health Insurance - Indemnity

## 2022-07-05 ENCOUNTER — Encounter: Admit: 2022-07-05 | Discharge: 2022-07-05 | Payer: Private Health Insurance - Indemnity

## 2022-07-14 ENCOUNTER — Encounter: Admit: 2022-07-14 | Discharge: 2022-07-14 | Payer: Private Health Insurance - Indemnity

## 2022-07-14 DIAGNOSIS — I421 Obstructive hypertrophic cardiomyopathy: Secondary | ICD-10-CM

## 2022-07-14 DIAGNOSIS — R0602 Shortness of breath: Secondary | ICD-10-CM

## 2022-07-14 DIAGNOSIS — R931 Abnormal findings on diagnostic imaging of heart and coronary circulation: Secondary | ICD-10-CM

## 2022-07-14 DIAGNOSIS — I517 Cardiomegaly: Secondary | ICD-10-CM

## 2022-07-14 NOTE — Progress Notes
Received call back to patient. She is agreeable to genetic testing, TC-PYP, and lab testing prior to [place]. She would like to reschedule OV with [facility] to week of 12/11.    Home address confirmed. Genetic testing kit ordered through [facility]. Requested kit be mailed to patient's home address on file.    Patient will complete blood tests at [facility] the week of 12/4. Lab orders mailed to patient's home along with appointment information.    Future Appointments   Date Time Provider [place]   09/12/2022 11:00 AM [facility] CVM Procedur   09/14/2022 10:00 AM Leane Platt, MD CVMSNCL CVM Exam

## 2022-07-19 ENCOUNTER — Encounter: Admit: 2022-07-19 | Discharge: 2022-07-19 | Payer: Private Health Insurance - Indemnity

## 2022-07-19 NOTE — Progress Notes
Difficulties getting a hold of patient;  She does not use email. She cancelled her October cardiology Heart fx appt.  She left a message that I could mail her camzyos info and that she was out of her other "cardiac med"  I assume metoprolol.  She also mentioned she was moving. I left another VM this morning to get new address to mail camzyos forms and information

## 2022-08-29 IMAGING — US ECHOCOMPL
1 series · 12 of 24 positions shown · non-contrast
Comparison: none

[Series 1: us echo 2d, complete · 97 acquisitions, 12 frames shown]
[im 5/97]
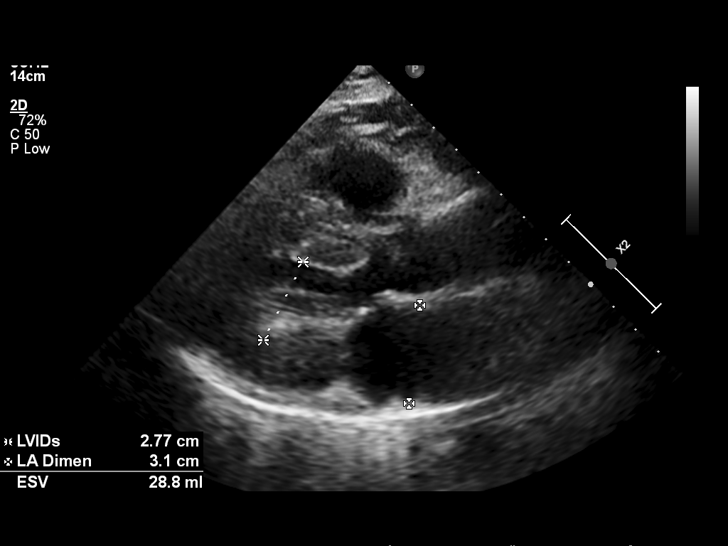
[im 13/97]
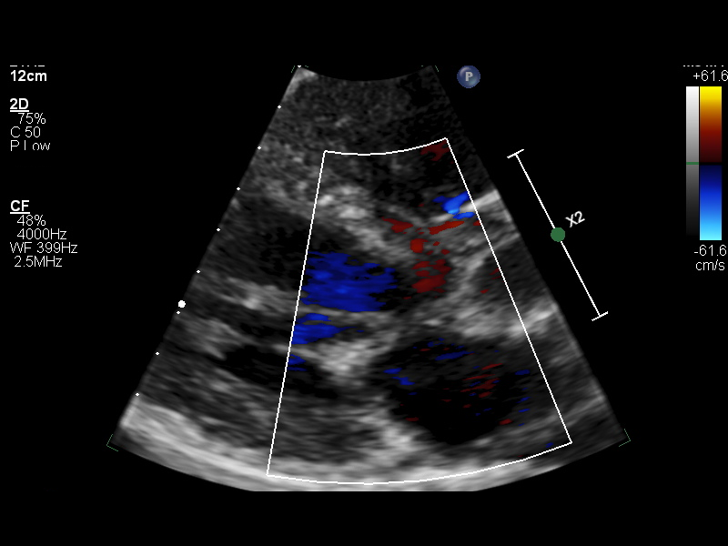
[im 17/97]
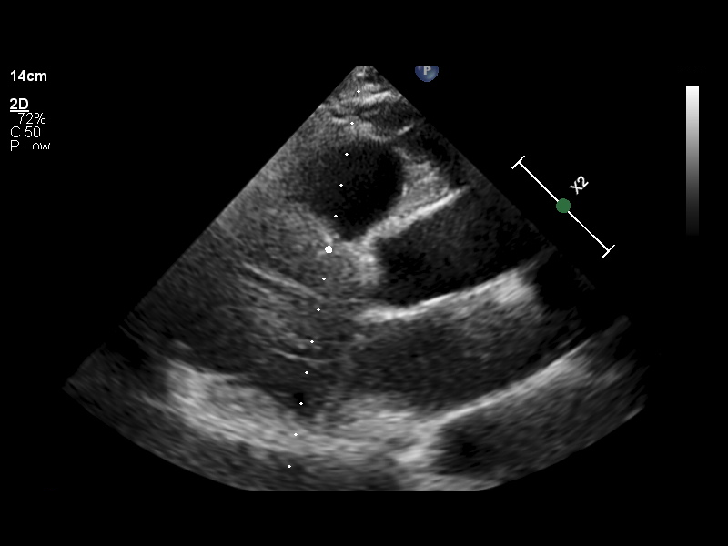
[im 26/97]
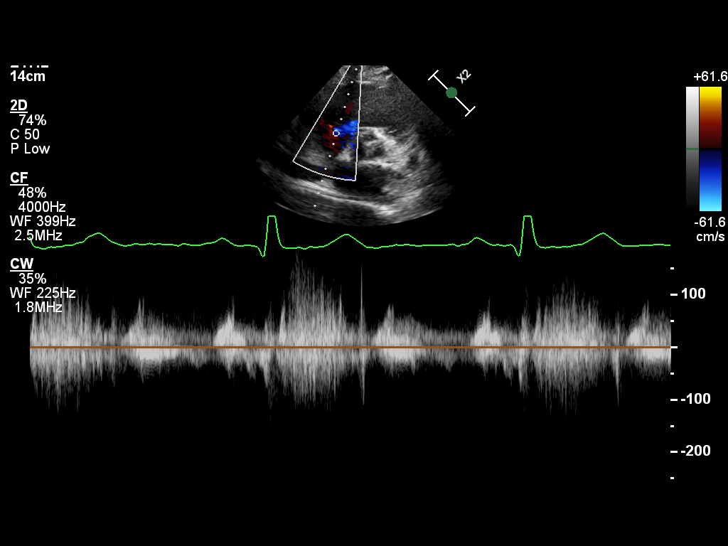
[im 34/97]
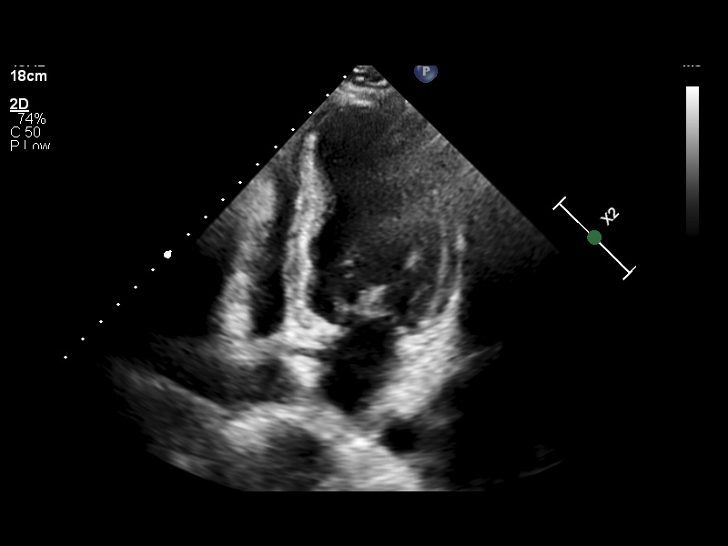
[im 38/97]
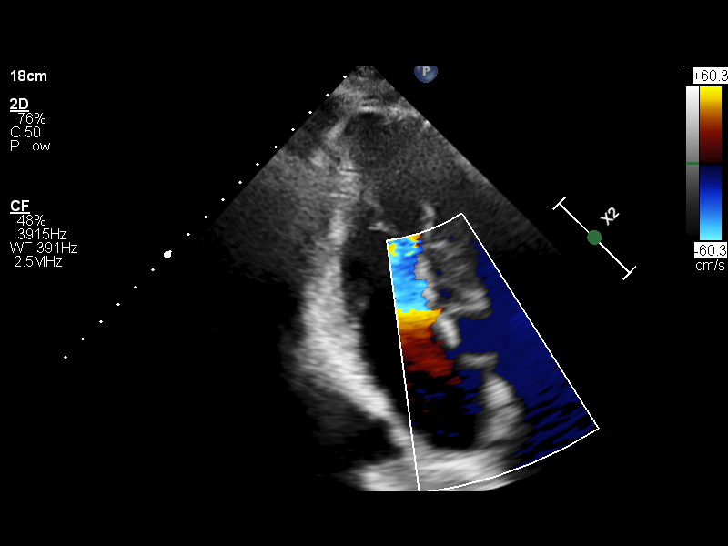
[im 46/97]
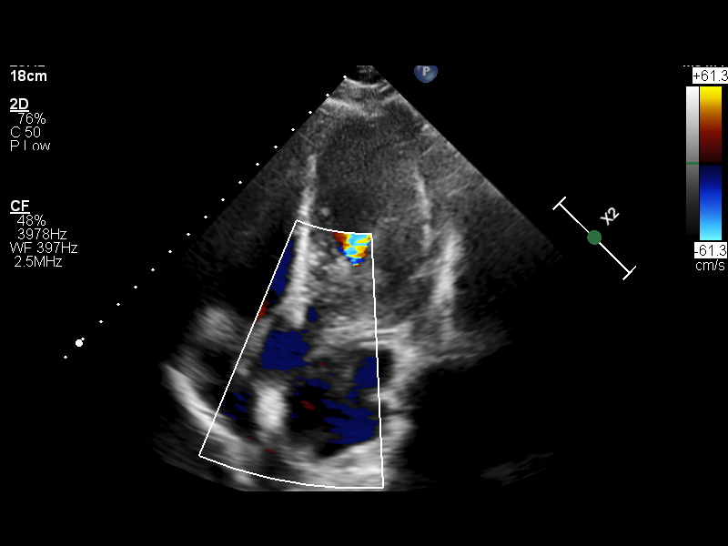
[im 59/97]
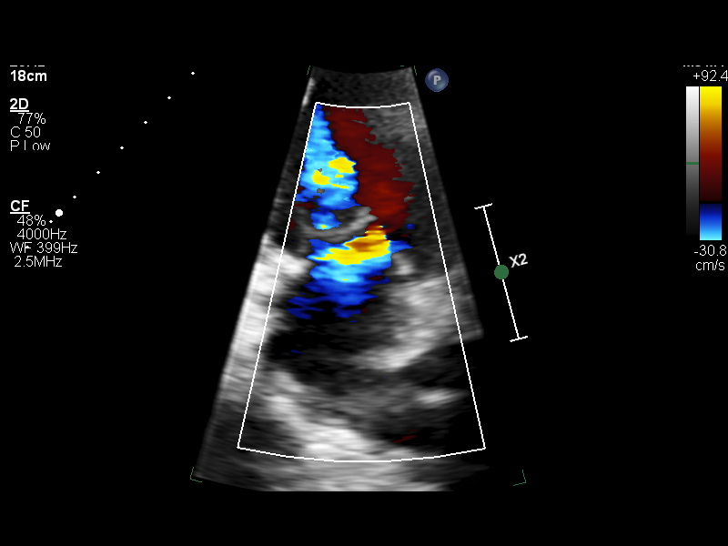
[im 67/97]
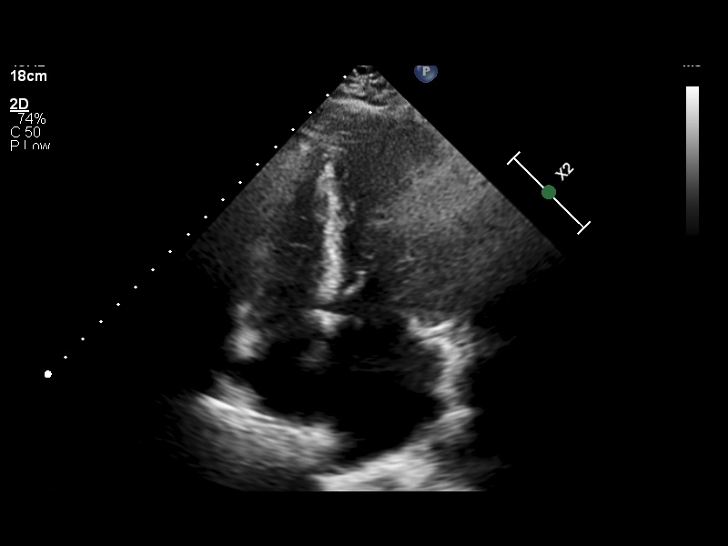
[im 80/97]
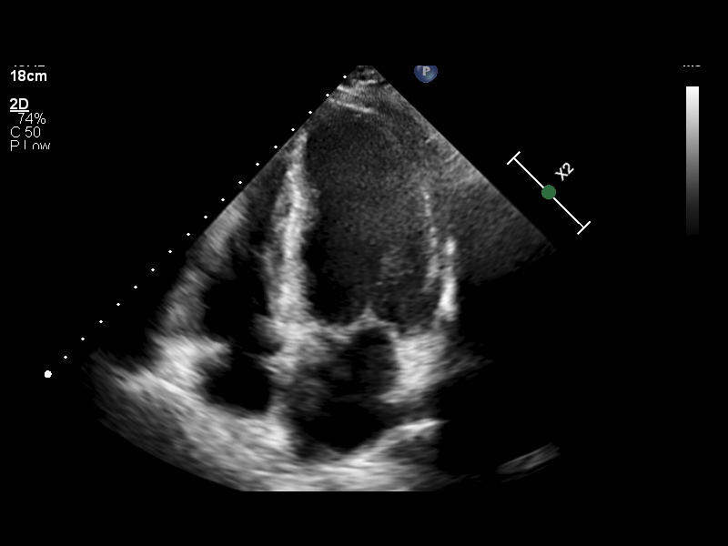
[im 84/97]
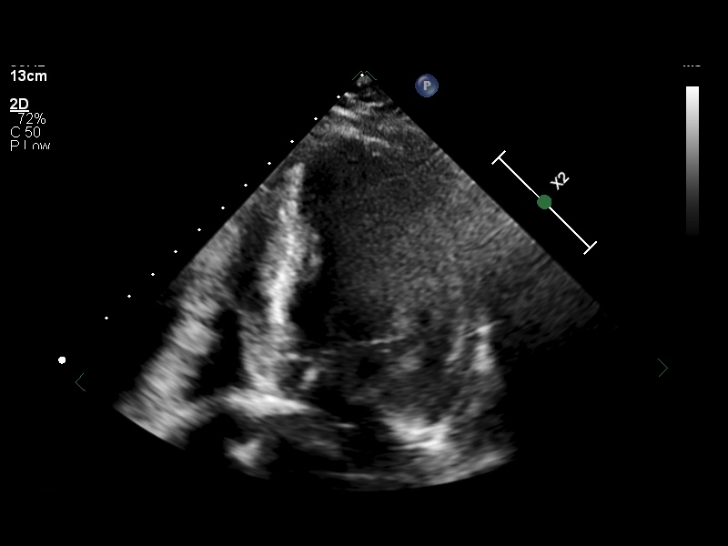
[im 97/97]
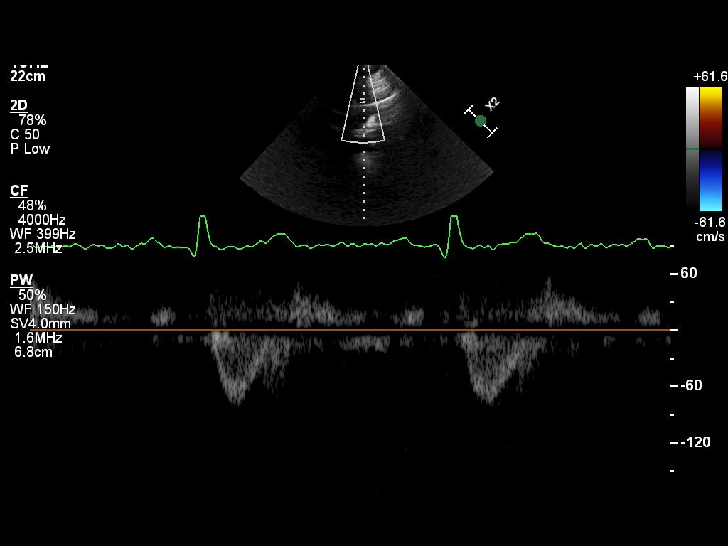

[12 of 24 positions shown; findings below may reference images not displayed]

10/20/21 -  2D + DOPPLER ECHO
Location Performed: [HOSPITAL]

Referring Provider:
Fellow:
Location of Interp:
Sonographer: External Staff

Indications: Hypertension          Abnormal echo

Vitals
Height   Weight   BSA (Calculated)   BP   Comments
160 cm (5' 3")   69.9 kg (154 lb)   1.76   127/71

Interpretation Summary
Left ventricular systolic function is within normal limits.
LVEF 60-65%
Normal diastolic function.
Basal sepal hypertrophy (basal septum measures 1.6 cm) with elevated velocities through the LVOT
(peak velocity 2.6 m/s) and Aortic valve (peak velocity is 3.2 m/s) with evidence of chordal Klengkeng
Maykel anterior motion of the mitral valve.  No visual evidence of aortic valve stenosis.  This is
most consistent with hypertrophic cardiomyopathy but cannot rule out a subaortic membrane.  Consider
NARMURAT for further evaluation if clinically indicated.
No pericardial effusion.

Echocardiographic Findings
Left Ventricle   The left ventricular size is normal. Concentric hypertrophy. The left ventricular
systolic function is normal. The visually estimated ejection fraction is 65%. There are no segmental
wall motion abnormalities. Normal left ventricular diastolic function. Normal left atrial pressure.
Right Ventricle   The right ventricular size is normal. The right ventricular systolic function is
normal.
Left Atrium   Normal size.
Right Atrium   Mildly dilated.
IVC/SVC   Normal central venous pressure (0-5 mm Hg).
Mitral Valve   Normal valve structure. No stenosis. Mild to moderate regurgitation.
Tricuspid Valve   Normal valve structure. No stenosis. Trace regurgitation.
Aortic Valve   Normal valve structure. No stenosis. No regurgitation.
Pulmonary   The pulmonic valve was not seen well but no Doppler evidence of stenosis. Trace
regurgitation.
Aorta   The aortic root and ascending aorta are normal in size.
Pericardium   No pericardial effusion.

Left Ventricular Wall Scoring
Resting   Score Index: 1.000   Percent Normal: 100.0%

The left ventricular wall motion is normal.

Left Heart 2D Measurements (Normal Ranges)
EF (Visual)
65 %
LVIDD
3.8 cm  (Range: 3.8 - 5.2)
LVIDS
2.8 cm  (Range: 2.2 - 3.5)
IVS
1.3 cm  (Range: 0.6 - 0.9)
LV PW
1.4 cm  (Range: 0.6 - 0.9)
LA Size
3.1 cm  (Range: 2.7 - 3.8)

Right Heart 2D   M-Mode Measurements (Normal Ranges) (Range)
RV Basal Dia
3.7 cm  (2.5 - 4.1)
RV Mid Dia
2.6 cm  (1.9 - 3.5)
YUVI JOEL
18.6 cm2  (<18)
M-Mode TAPSE
3.0 cm  (>1.7)

Left Heart 2D Addnl Measurements (Normal Ranges)
LV Systolic Vol
17 mL  (Range: 14 - 42)
LV Systolic Vol Index
10 mL/m2  (Range: 8 - 24)
LV Diastolic Vol
161 mL  (Range: 46 - 106)
LV Diastolic Vol Index
91 mL/m2  (Range: 29 - 61)
LA Vol
56 mL  (Range: 22 - 52)
LA Vol Index
31.82 mL/m2  (Range: 16 - 34)
LV Mass
183 g  (Range: 67 - 162)
LV Mass Index
104 g/m2  (Range: 43 - 95)
RWT
0.74  (Range: <=0.42)

Aortic Root Measurements (Normal Ranges)
Sinus
2.7 cm  (Range: 2.4 - 3.6)
ARIY ROLLAND
2.8 cm

Doppler (Spectral and Color Flow)
Estimated Peak Systolic PA Pressure

Tech Notes:

tm

## 2022-09-09 ENCOUNTER — Encounter: Admit: 2022-09-09 | Discharge: 2022-09-09 | Payer: Private Health Insurance - Indemnity

## 2022-09-12 ENCOUNTER — Encounter: Admit: 2022-09-12 | Discharge: 2022-09-12 | Payer: Private Health Insurance - Indemnity

## 2022-09-14 ENCOUNTER — Encounter: Admit: 2022-09-14 | Discharge: 2022-09-14 | Payer: Private Health Insurance - Indemnity

## 2022-09-14 NOTE — Telephone Encounter
Received call from patient, she was unable to make appointment today and states that testing at [facility] had to be cancelled anyway. She states she will call back after the first of the year to reschedule.

## 2022-09-22 ENCOUNTER — Encounter: Admit: 2022-09-22 | Discharge: 2022-09-22 | Payer: Private Health Insurance - Indemnity

## 2023-02-06 IMAGING — US ECHOCOMPL
1 series · 12 of 24 positions shown · non-contrast
Comparison: none

[Series 1: us echo 2d, complete · 97 acquisitions, 12 frames shown]
[im 5/97]
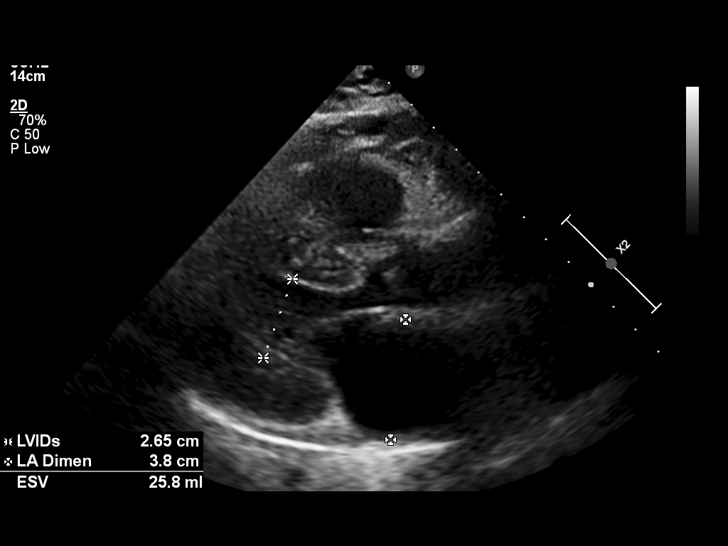
[im 13/97]
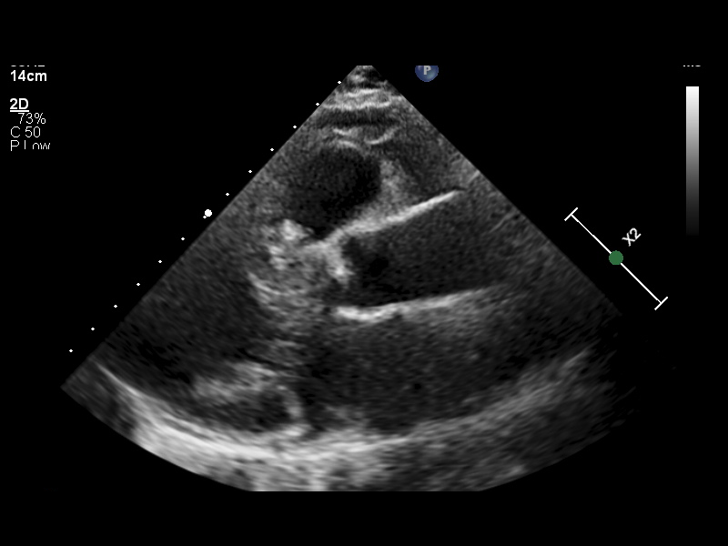
[im 21/97]
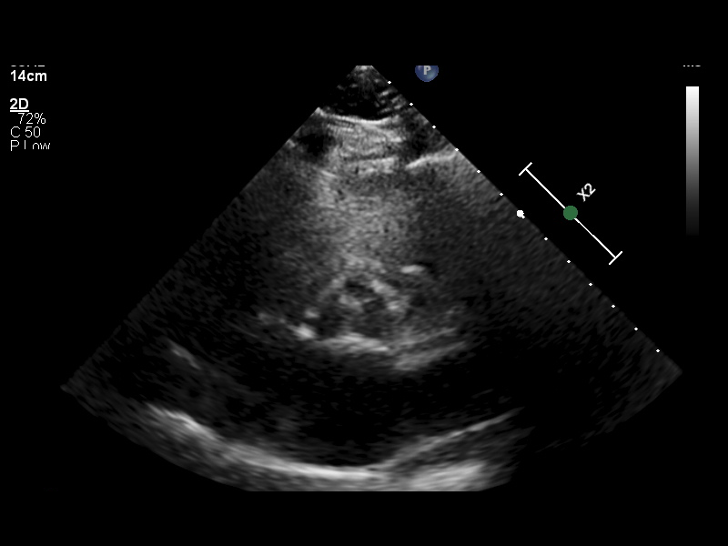
[im 30/97]
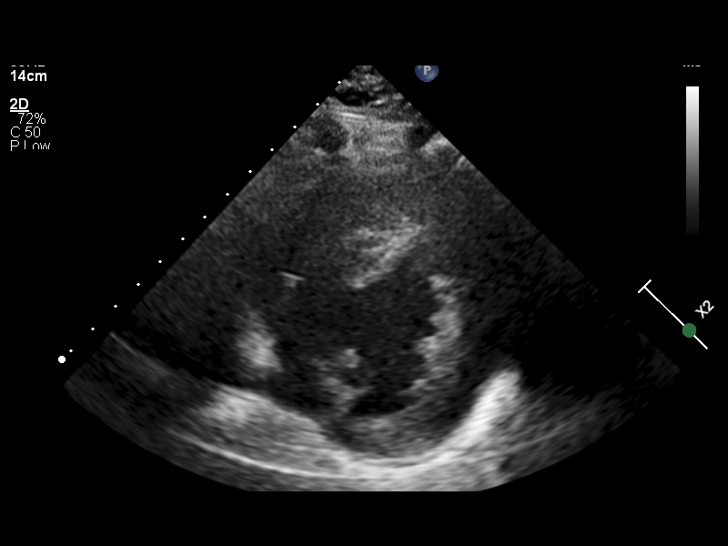
[im 34/97]
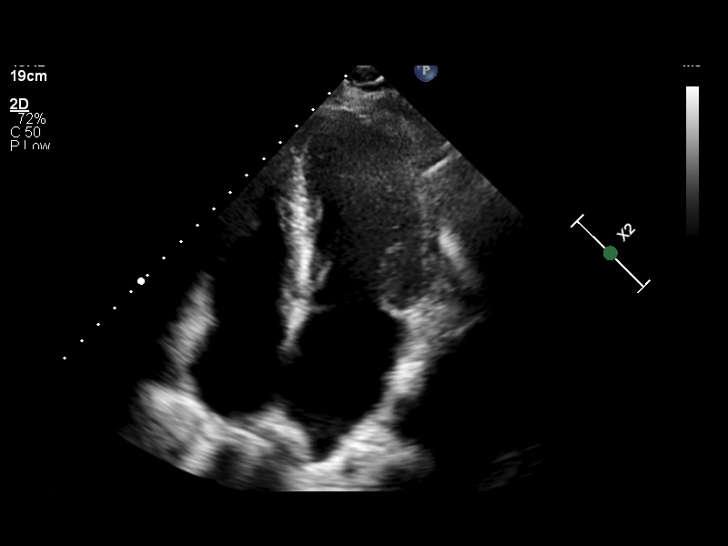
[im 46/97]
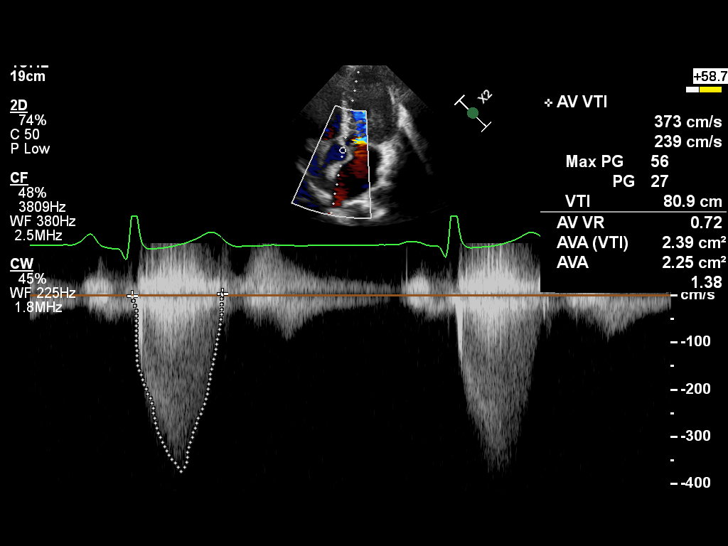
[im 55/97]
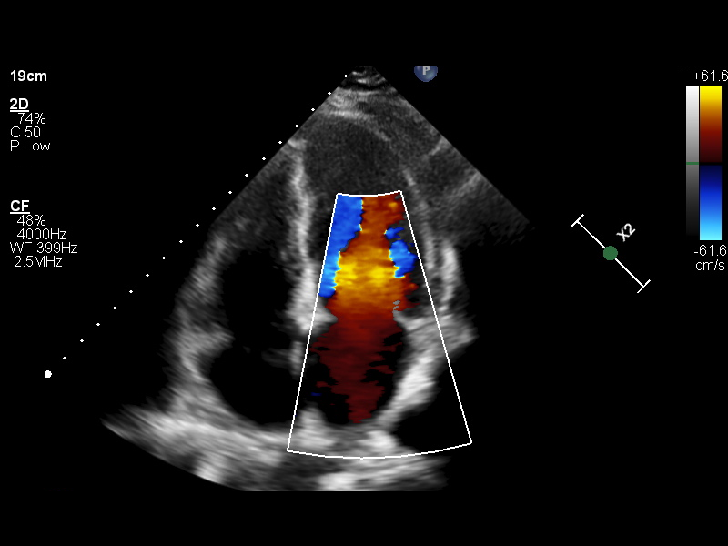
[im 63/97]
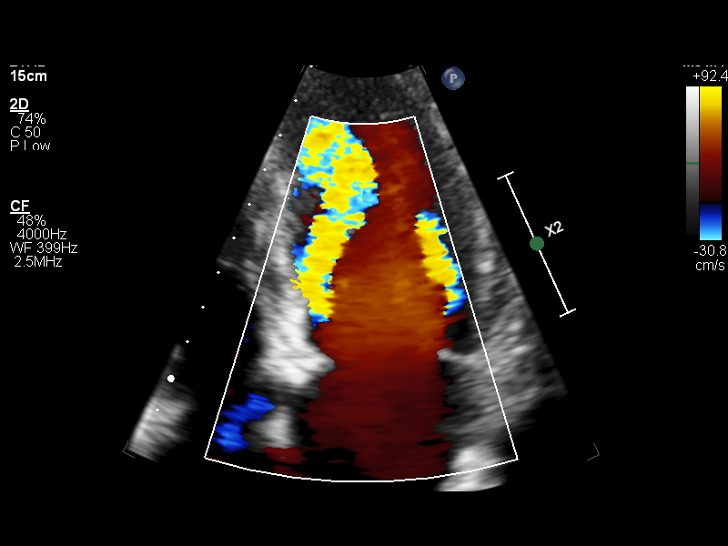
[im 71/97]
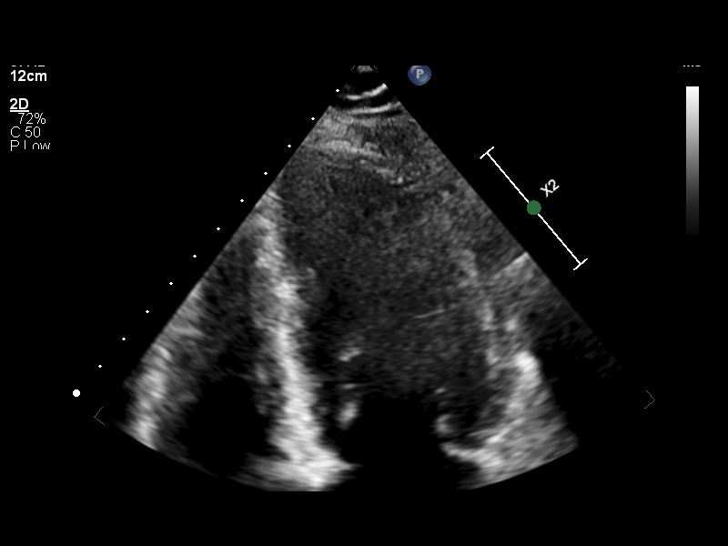
[im 80/97]
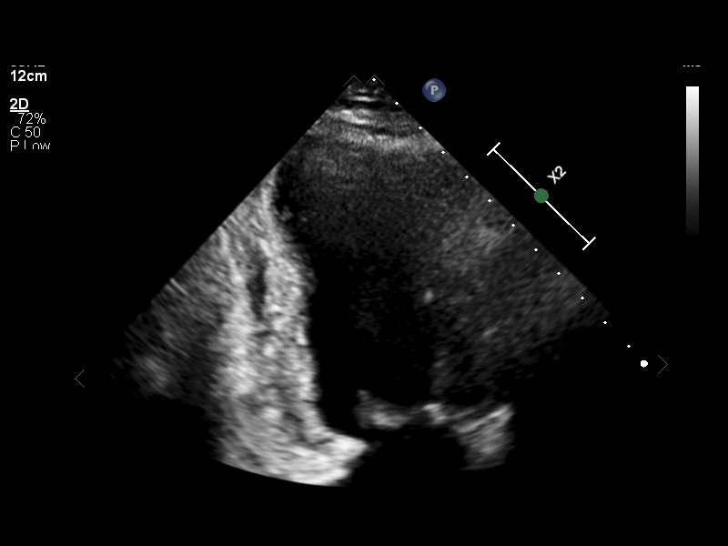
[im 88/97]
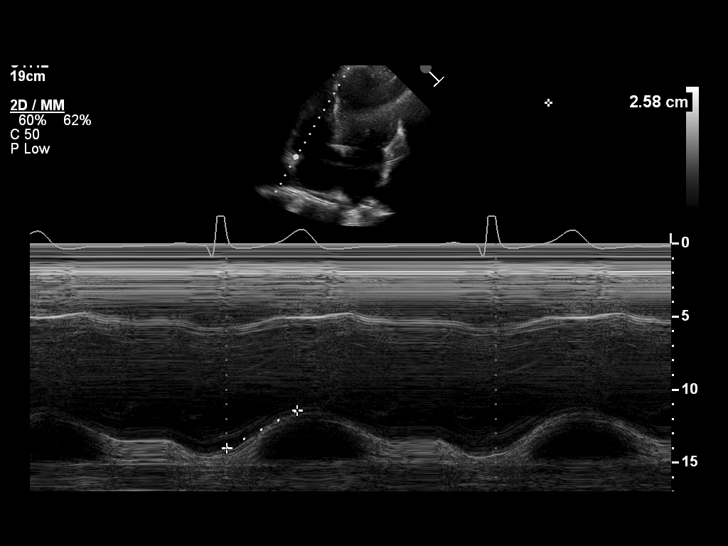
[im 97/97]
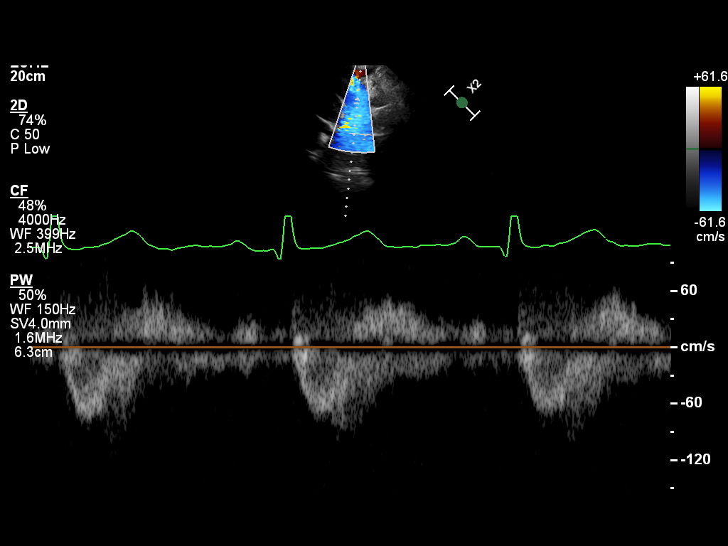

[12 of 24 positions shown; findings below may reference images not displayed]

03/30/22 -  2D + DOPPLER ECHO
Location Performed: [HOSPITAL]

Referring Provider: Hank
Caicedo:
Location of Interp:
Sonographer: External Staff
Machine:  [employer]
TM in [place]

Indications: Hypertension          HOCM

Vitals
Height   Weight   BSA (Calculated)   BP   Comments
160 cm (5' 3")   69.9 kg (154 lb)   1.76   119/68

Interpretation Summary
The left ventricular size is normal. Wall thickness is increased. Moderate concentric hypertrophy.
The left ventricular systolic function is hyperdynamic. The visually estimated ejection fraction is
75%. There are no segmental wall motion abnormalities
Systolic anterior motion of the anterior mitral leaflet and chordal apparatus are present with
septal leaflet contact. Peak velocity through the left ventricular outflow tract at rest is 4 Rt/Kettmann Kastendeuch
th a peak gradient of 64 mmHg. Valsalva maneuver was not performed.
The right ventricular size, wall thickness and systolic function are normal.
Left Atrium: Severely dilated.
The anterior mitral leaflet is elongated. There is a eccentric posteriorly directed jet of mild to
moderate mitral regurgitation as a consequence of LVOT obstruction.
Estimated Peak Systolic PA Pressure 34 mmHg
No pericardial effusion.

Echocardiographic Findings
Left Ventricle   The left ventricular size is normal. Wall thickness is increased. Moderate
concentric hypertrophy. The left ventricular systolic function is hyperdynamic. The visually
estimated ejection fraction is 75%. There are no segmental wall motion abnormalities.
Right Ventricle   The right ventricular size, wall thickness and systolic function are normal.
Left Atrium   Severely dilated.
Right Atrium   Normal size.
IVC/SVC   Normal central venous pressure (0-5 mm Hg).
Mitral Valve   Normal valve structure. No stenosis. Mild to moderate regurgitation.
Tricuspid Valve   Normal valve structure. No stenosis. Trace regurgitation.
Aortic Valve   No regurgitation.
Pulmonary   The pulmonic valve was not well seen. Normal valve structure. No stenosis. No
regurgitation.
Aorta   The aortic root and ascending aorta are normal in size.
Pericardium   No pericardial effusion.

Left Ventricular Wall Scoring
Score Index: 1.000   Percent Normal: 100.0%

The left ventricular wall motion is normal.

Left Heart 2D Measurements (Normal Ranges)
EF (Visual)
75 %
LVIDD
4.3 cm  (Range: 3.8 - 5.2)
LVIDS
2.7 cm  (Range: 2.2 - 3.5)
IVS
1.2 cm  (Range: 0.6 - 0.9)
LV PW
1.1 cm  (Range: 0.6 - 0.9)
LA Size
3.8 cm  (Range: 2.7 - 3.8)

Right Heart 2D   M-Mode Measurements (Normal Ranges) (Range)
RV Basal Dia
3.8 cm  (2.5 - 4.1)
RV Mid Dia
2.7 cm  (1.9 - 3.5)
AXOVI
14.9 cm2  (<18)
M-Mode TAPSE
2.6 cm  (>1.7)

Left Heart 2D Addnl Measurements (Normal Ranges)
LV Systolic Vol
16 mL  (Range: 14 - 42)
LV Systolic Vol Index
9 mL/m2  (Range: 8 - 24)
LV Diastolic Vol
161 mL  (Range: 46 - 106)
LV Diastolic Vol Index
91 mL/m2  (Range: 29 - 61)
LA Vol
95 mL  (Range: 22 - 52)
LA Vol Index
53.98 mL/m2  (Range: 16 - 34)
LV Mass
174 g  (Range: 67 - 162)
LV Mass Index
99 g/m2  (Range: 43 - 95)
RWT
0.51  (Range: <=0.42)

Aortic Root Measurements (Normal Ranges)
Sinus
2.7 cm  (Range: 2.4 - 3.6)
AUDRUNAS BUREIKIENE
3.1 cm

Doppler (Spectral and Color Flow)
Estimated Peak Systolic PA Pressure

Tech Notes:

htn; not approved for definity

## 2023-03-22 ENCOUNTER — Encounter: Admit: 2023-03-22 | Discharge: 2023-03-22 | Payer: Private Health Insurance - Indemnity

## 2023-04-20 ENCOUNTER — Encounter: Admit: 2023-04-20 | Discharge: 2023-04-20 | Payer: Private Health Insurance - Indemnity

## 2023-04-20 DIAGNOSIS — I517 Cardiomegaly: Secondary | ICD-10-CM

## 2023-04-20 DIAGNOSIS — I421 Obstructive hypertrophic cardiomyopathy: Secondary | ICD-10-CM

## 2023-04-21 ENCOUNTER — Encounter: Admit: 2023-04-21 | Discharge: 2023-04-21 | Payer: Private Health Insurance - Indemnity

## 2023-05-01 ENCOUNTER — Encounter: Admit: 2023-05-01 | Discharge: 2023-05-01 | Payer: Private Health Insurance - Indemnity

## 2023-05-01 NOTE — Progress Notes
Faxed echo order to [place] echo scheduling- [phone]    Called pts primary insurance- [employer] and secondary insurance- [facility] [employer] [place]- no Prior Berkley Harvey is needed for CPT 93306- echocardiogram. Reference number for [employer] is [phone]. Reference number for [facility] is [id number].    Faxed note to [facility] echo scheduling.

## 2023-05-09 ENCOUNTER — Encounter: Admit: 2023-05-09 | Discharge: 2023-05-09 | Payer: Private Health Insurance - Indemnity

## 2023-05-09 MED ORDER — METOPROLOL SUCCINATE 100 MG PO TB24
100 mg | ORAL_TABLET | Freq: Every day | ORAL | 0 refills | 90.00000 days | Status: AC
Start: 2023-05-09 — End: ?

## 2023-05-09 NOTE — Telephone Encounter
Called and spoke to echo scheduling at [facility] who stated pt was scheduled on 8/5 for an echo but she called the morning of the appt and LVM canceling.  Scheduling is unable to reach pt to reschedule.     Attempted to reach pt to inform echo is needed before OV on 8/13 with Dr. Paris Lore

## 2023-05-16 ENCOUNTER — Encounter: Admit: 2023-05-16 | Discharge: 2023-05-16 | Payer: Private Health Insurance - Indemnity
